# Patient Record
Sex: Male | Born: 1960 | Hispanic: No | Marital: Married | Smoking: Never smoker
Health system: Southern US, Community
[De-identification: ages and names within clinical notes are randomized; demographics above are authoritative.]

## PROBLEM LIST (undated history)

## (undated) DIAGNOSIS — G4733 Obstructive sleep apnea (adult) (pediatric): Secondary | ICD-10-CM

## (undated) DIAGNOSIS — I10 Essential (primary) hypertension: Secondary | ICD-10-CM

## (undated) HISTORY — DX: Obstructive sleep apnea (adult) (pediatric): G47.33

## (undated) HISTORY — PX: TONSILLECTOMY: SHX5217

## (undated) HISTORY — DX: Essential (primary) hypertension: I10

---

## 2005-09-20 ENCOUNTER — Ambulatory Visit (HOSPITAL_COMMUNITY): Admission: RE | Admit: 2005-09-20 | Discharge: 2005-09-20 | Payer: Self-pay | Admitting: Surgery

## 2005-09-20 ENCOUNTER — Encounter (INDEPENDENT_AMBULATORY_CARE_PROVIDER_SITE_OTHER): Payer: Self-pay | Admitting: *Deleted

## 2005-09-20 ENCOUNTER — Ambulatory Visit (HOSPITAL_BASED_OUTPATIENT_CLINIC_OR_DEPARTMENT_OTHER): Admission: RE | Admit: 2005-09-20 | Discharge: 2005-09-20 | Payer: Self-pay | Admitting: Surgery

## 2005-12-30 HISTORY — PX: LIPOMA EXCISION: SHX5283

## 2006-02-04 ENCOUNTER — Encounter: Admission: RE | Admit: 2006-02-04 | Discharge: 2006-02-04 | Payer: Self-pay | Admitting: Gastroenterology

## 2010-01-14 ENCOUNTER — Inpatient Hospital Stay (HOSPITAL_COMMUNITY): Admission: EM | Admit: 2010-01-14 | Discharge: 2010-01-18 | Payer: Self-pay | Admitting: Emergency Medicine

## 2010-01-14 ENCOUNTER — Ambulatory Visit: Payer: Self-pay | Admitting: Internal Medicine

## 2011-03-17 LAB — DIFFERENTIAL
Basophils Absolute: 0 10*3/uL (ref 0.0–0.1)
Basophils Absolute: 0 10*3/uL (ref 0.0–0.1)
Basophils Absolute: 0 10*3/uL (ref 0.0–0.1)
Basophils Relative: 0 % (ref 0–1)
Basophils Relative: 1 % (ref 0–1)
Basophils Relative: 1 % (ref 0–1)
Eosinophils Absolute: 0.1 10*3/uL (ref 0.0–0.7)
Eosinophils Absolute: 0.1 10*3/uL (ref 0.0–0.7)
Eosinophils Absolute: 0.1 10*3/uL (ref 0.0–0.7)
Eosinophils Absolute: 0.1 10*3/uL (ref 0.0–0.7)
Eosinophils Relative: 6 % — ABNORMAL HIGH (ref 0–5)
Eosinophils Relative: 9 % — ABNORMAL HIGH (ref 0–5)
Lymphocytes Relative: 27 % (ref 12–46)
Lymphocytes Relative: 47 % — ABNORMAL HIGH (ref 12–46)
Lymphs Abs: 0.3 10*3/uL — ABNORMAL LOW (ref 0.7–4.0)
Lymphs Abs: 0.3 10*3/uL — ABNORMAL LOW (ref 0.7–4.0)
Monocytes Absolute: 0.1 10*3/uL (ref 0.1–1.0)
Monocytes Absolute: 0.1 10*3/uL (ref 0.1–1.0)
Monocytes Absolute: 0.2 10*3/uL (ref 0.1–1.0)
Monocytes Relative: 9 % (ref 3–12)
Monocytes Relative: 9 % (ref 3–12)
Monocytes Relative: 9 % (ref 3–12)
Neutro Abs: 0.8 10*3/uL — ABNORMAL LOW (ref 1.7–7.7)
Neutrophils Relative %: 36 % — ABNORMAL LOW (ref 43–77)
Neutrophils Relative %: 39 % — ABNORMAL LOW (ref 43–77)
Neutrophils Relative %: 70 % (ref 43–77)

## 2011-03-17 LAB — CBC
HCT: 30.6 % — ABNORMAL LOW (ref 39.0–52.0)
HCT: 32.6 % — ABNORMAL LOW (ref 39.0–52.0)
HCT: 32.9 % — ABNORMAL LOW (ref 39.0–52.0)
HCT: 33.1 % — ABNORMAL LOW (ref 39.0–52.0)
HCT: 35.4 % — ABNORMAL LOW (ref 39.0–52.0)
Hemoglobin: 10.6 g/dL — ABNORMAL LOW (ref 13.0–17.0)
Hemoglobin: 11.2 g/dL — ABNORMAL LOW (ref 13.0–17.0)
Hemoglobin: 11.2 g/dL — ABNORMAL LOW (ref 13.0–17.0)
MCHC: 34.3 g/dL (ref 30.0–36.0)
MCV: 87.8 fL (ref 78.0–100.0)
MCV: 87.9 fL (ref 78.0–100.0)
RBC: 3.74 MIL/uL — ABNORMAL LOW (ref 4.22–5.81)
RBC: 3.76 MIL/uL — ABNORMAL LOW (ref 4.22–5.81)
RBC: 4.03 MIL/uL — ABNORMAL LOW (ref 4.22–5.81)
RDW: 13.5 % (ref 11.5–15.5)
RDW: 13.8 % (ref 11.5–15.5)
RDW: 14 % (ref 11.5–15.5)
WBC: 1.3 10*3/uL — CL (ref 4.0–10.5)
WBC: 2.1 10*3/uL — ABNORMAL LOW (ref 4.0–10.5)
WBC: 2.2 10*3/uL — ABNORMAL LOW (ref 4.0–10.5)

## 2011-03-17 LAB — URINALYSIS, ROUTINE W REFLEX MICROSCOPIC
Glucose, UA: NEGATIVE mg/dL
Hgb urine dipstick: NEGATIVE
Specific Gravity, Urine: 1.027 (ref 1.005–1.030)
Urobilinogen, UA: 0.2 mg/dL (ref 0.0–1.0)

## 2011-03-17 LAB — COMPREHENSIVE METABOLIC PANEL
Alkaline Phosphatase: 37 U/L — ABNORMAL LOW (ref 39–117)
BUN: 12 mg/dL (ref 6–23)
GFR calc Af Amer: >60 mL/min (ref >60)
Glucose, Bld: 110 mg/dL — ABNORMAL HIGH (ref 70–99)
Potassium: 4.1 mEq/L (ref 3.5–5.1)
Total Bilirubin: 0.3 mg/dL (ref 0.3–1.2)
Total Protein: 6.6 g/dL (ref 6.0–8.3)

## 2011-03-17 LAB — URINE CULTURE
Colony Count: NO GROWTH
Culture: NO GROWTH

## 2011-03-17 LAB — CULTURE, BLOOD (ROUTINE X 2)

## 2011-03-17 LAB — BASIC METABOLIC PANEL
Calcium: 8 mg/dL — ABNORMAL LOW (ref 8.4–10.5)
Creatinine, Ser: 1.04 mg/dL (ref 0.4–1.5)
GFR calc non Af Amer: >60 mL/min (ref >60)
Sodium: 133 mEq/L — ABNORMAL LOW (ref 135–145)

## 2011-03-17 LAB — LACTATE DEHYDROGENASE: LDH: 172 U/L (ref 94–250)

## 2011-03-17 LAB — TSH: TSH: 0.996 u[IU]/mL (ref 0.350–4.500)

## 2011-03-17 LAB — PATHOLOGIST SMEAR REVIEW

## 2011-05-17 NOTE — Op Note (Signed)
NAME:  SNELSON, MD                    ACCOUNT NO.:  [id number]   MEDICAL RECORD NO.:  [id number]          PATIENT TYPE:  OUT   LOCATION:  [facility]                         FACILITY:  [facility]   PHYSICIAN:  Sandria Bales. Ezzard Standing, M.D.  DATE OF BIRTH:  [birth year]   DATE OF PROCEDURE:  09/20/2005  DATE OF DISCHARGE:  09/20/2005                                 OPERATIVE REPORT   PREOPERATIVE DIAGNOSIS:  Mass, right axilla.   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   PROCEDURE:  Excision of right axillary mass.   SURGEON:  Sandria Bales. Ezzard Standing, M.D.   ASSISTANT:  None.   ANESTHESIA:  MAC with approximately 20 mL of 1% Xylocaine plain.   SPECIMENS:  Sent to pathology.   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  This is probably a lipoma  but could also be a lymph node.  The patient comes for excision of this  mass.   DESCRIPTION OF PROCEDURE:  The patient underwent MAC anesthesia.  His right  axilla was prepped with Betadine solution and sterilely draped.  A linear  incision was made directly over the mass and dissection was carried down to  the  mass.  The mass was excised and sent to pathology.  Hemostasis was  controlled with Bovie electrocautery.  The skin was closed with 3-0 Vicryl  sutures and a 5-0 running subcuticular Vicryl, painted with tincture of  Benzoin and Steri-Strips.   He will be seen back in two weeks for follow up, call for any problems.  Again, the specimen was sent to pathology.      Sandria Bales. Ezzard Standing, M.D.  Electronically Signed     DHN/MEDQ  D:  12/12/2005  T:  12/13/2005  Job:  [id number]

## 2012-08-04 ENCOUNTER — Encounter (HOSPITAL_COMMUNITY): Payer: Self-pay

## 2012-08-04 ENCOUNTER — Encounter (HOSPITAL_COMMUNITY): Payer: Self-pay | Admitting: Anesthesiology

## 2012-08-04 ENCOUNTER — Ambulatory Visit (HOSPITAL_COMMUNITY): Admit: 2012-08-04 | Payer: Self-pay | Admitting: Cardiology

## 2012-08-04 SURGERY — CARDIOVERSION
Anesthesia: Monitor Anesthesia Care

## 2016-10-07 ENCOUNTER — Ambulatory Visit (INDEPENDENT_AMBULATORY_CARE_PROVIDER_SITE_OTHER): Payer: BLUE CROSS/BLUE SHIELD | Admitting: Rheumatology

## 2016-10-07 DIAGNOSIS — M25562 Pain in left knee: Secondary | ICD-10-CM | POA: Diagnosis not present

## 2017-11-07 ENCOUNTER — Encounter: Payer: Self-pay | Admitting: Rheumatology

## 2017-11-07 ENCOUNTER — Ambulatory Visit (INDEPENDENT_AMBULATORY_CARE_PROVIDER_SITE_OTHER): Payer: BLUE CROSS/BLUE SHIELD | Admitting: Rheumatology

## 2017-11-07 VITALS — BP 119/74 | HR 60 | Resp 16 | Ht 64.0 in | Wt 136.0 lb

## 2017-11-07 DIAGNOSIS — I10 Essential (primary) hypertension: Secondary | ICD-10-CM | POA: Diagnosis not present

## 2017-11-07 DIAGNOSIS — M170 Bilateral primary osteoarthritis of knee: Secondary | ICD-10-CM | POA: Diagnosis not present

## 2017-11-07 DIAGNOSIS — M7712 Lateral epicondylitis, left elbow: Secondary | ICD-10-CM | POA: Diagnosis not present

## 2017-11-07 MED ORDER — LIDOCAINE HCL (PF) 1 % IJ SOLN
0.5000 mL | INTRAMUSCULAR | Status: AC | PRN
  Administered 2017-11-07: .5 mL

## 2017-11-07 MED ORDER — TRIAMCINOLONE ACETONIDE 40 MG/ML IJ SUSP
20.0000 mg | INTRAMUSCULAR | Status: AC | PRN
  Administered 2017-11-07: 20 mg via INTRA_ARTICULAR

## 2017-11-07 NOTE — Progress Notes (Signed)
Medication Samples have been provided to the patient.  Drug name: Flector Patch       Strength: 1.3%        Qty: 2 patches (1 box)  LOT: [id number]  Exp.Date: 09/2018  Dosing instructions: Apply patch topically to affected area every 12 hours.   The patient has been instructed regarding the correct time, dose, and frequency of taking this medication, including desired effects and most common side effects.   Essie HartJulie E Saunders 2:40 PM 11/07/2017

## 2017-11-07 NOTE — Progress Notes (Addendum)
Office Visit Note  Patient: Parker Campbell             Date of Birth: 1961/02/12           MRN: [id number]             PCP: Patient, No Pcp Per Referring: No ref. provider found Visit Date: 11/07/2017 Occupation: @GUAROCC @    Subjective:  Left elbow pain.   History of Present Illness: Parker Campbell is a 56 y.o. male seen on urgent basis today. He states he's been playing golf and developed pain in his left elbow. The symptoms have been going on for the last few weeks. He is not having any relief from using topical anti-inflammatories and also wearing a brace. He wants to have cortisone injection. He states his knee joint pain is tolerable. He is been running and exercising without much discomfort.  Activities of Daily Living:  Patient reports morning stiffness for 0 minute.   Patient Denies nocturnal pain.  Difficulty dressing/grooming: Denies Difficulty climbing stairs: Denies Difficulty getting out of chair: Denies Difficulty using hands for taps, buttons, cutlery, and/or writing: Denies   Review of Systems  Constitutional: Negative for fatigue, night sweats and weakness ( ).  HENT: Negative for mouth sores, mouth dryness and nose dryness.   Eyes: Negative for redness and dryness.  Respiratory: Negative for shortness of breath and difficulty breathing.   Cardiovascular: Negative for chest pain, palpitations, hypertension, irregular heartbeat and swelling in legs/feet.  Gastrointestinal: Negative for constipation and diarrhea.  Endocrine: Negative for increased urination.  Musculoskeletal: Positive for arthralgias and joint pain. Negative for joint swelling, myalgias, muscle weakness, morning stiffness, muscle tenderness and myalgias.  Skin: Negative for color change, rash, hair loss, nodules/bumps, skin tightness, ulcers and sensitivity to sunlight.  Allergic/Immunologic: Negative for susceptible to infections.  Neurological: Negative for dizziness, fainting, memory  loss and night sweats.  Hematological: Negative for swollen glands.  Psychiatric/Behavioral: Negative for depressed mood and sleep disturbance. The patient is not nervous/anxious.     PMFS History:  Patient Active Problem List   Diagnosis Date Noted  . Essential hypertension 11/07/2017  . Primary osteoarthritis of both knees 11/07/2017    Past Medical History:  Diagnosis Date  . Hypertension     Family History  Problem Relation Age of Onset  . CAD Father   . Alcoholism Brother    Past Surgical History:  Procedure Laterality Date  . LIPOMA EXCISION  2007   left axilla   . TONSILLECTOMY  age 89   Social History   Social History Narrative  . Not on file     Objective: Vital Signs: BP 119/74 (BP Location: Left Arm, Patient Position: Sitting, Cuff Size: Normal)   Pulse 60   Resp 16   Ht 5\' 4"  (1.626 m)   Wt 136 lb (61.7 kg)   BMI 23.34 kg/m    Physical Exam  Constitutional: He is oriented to person, place, and time. He appears well-developed and well-nourished.  HENT:  Head: Normocephalic and atraumatic.  Eyes: Conjunctivae and EOM are normal. Pupils are equal, round, and reactive to light.  Neck: Normal range of motion. Neck supple.  Cardiovascular: Normal rate, regular rhythm and normal heart sounds.  Pulmonary/Chest: Effort normal and breath sounds normal.  Abdominal: Soft. Bowel sounds are normal.  Neurological: He is alert and oriented to person, place, and time.  Skin: Skin is warm and dry. Capillary refill takes less than 2 seconds.  Psychiatric: He has  a normal mood and affect. His behavior is normal.  Nursing note and vitals reviewed.    Musculoskeletal Exam: C-spine and thoracic lumbar spine good range of motion. Shoulder joints elbow joints wrist joint MCPs PIPs DIPs with good range of motion. He had tenderness on palpation over left lateral epicondyle area. All the joints are full range of motion.  CDAI Exam: No CDAI exam completed.     Investigation: No additional findings.   Imaging: No results found.  Speciality Comments: No specialty comments available.    Procedures:  Medium Joint Inj: L lateral epicondyle on 11/07/2017 12:44 PM Indications: pain Details: 27 G 1.5 in needle, lateral approach Medications: 0.5 mL lidocaine (PF) 1 %; 20 mg triamcinolone acetonide 40 MG/ML Aspirate: 0 mL Outcome: tolerated well, no immediate complications Consent was given by the patient. Immediately prior to procedure a time out was called to verify the correct patient, procedure, equipment, support staff and site/side marked as required. Patient was prepped and draped in the usual sterile fashion.     Allergies: Patient has no allergy information on record.   Assessment / Plan:     Visit Diagnoses: Lateral epicondylitis, left elbow -the symptoms are consistent with left lateral epicondylitis triggered by playing golf. He's tried conservative therapy which has failed. Per his request. Was prepped in sterile fashion and injected with cortisone. The procedures as described above. Plan: Medium Joint Inj: L lateral epicondyle. I've also given prescription for Flector patch which can be used topically. Side effects were reviewed. I've advised him to use tennis elbow brace.   Primary osteoarthritis of both knees - And bilateral moderate chondromalacia patella  Essential hypertension : His blood pressure is controlled.   Orders: Orders Placed This Encounter  Procedures  . Medium Joint Inj: L lateral epicondyle   No orders of the defined types were placed in this encounter.     Follow-Up Instructions: Return if symptoms worsen or fail to improve.   Pollyann SavoyShaili Renisha Cockrum, MD  Note - This record has been created using Animal nutritionistDragon software.  Chart creation errors have been sought, but may not always  have been located. Such creation errors do not reflect on  the standard of medical care.

## 2020-03-24 ENCOUNTER — Ambulatory Visit: Payer: BC Managed Care – PPO | Attending: Internal Medicine

## 2020-03-24 ENCOUNTER — Other Ambulatory Visit: Payer: Self-pay

## 2020-03-24 DIAGNOSIS — Z20822 Contact with and (suspected) exposure to covid-19: Secondary | ICD-10-CM

## 2020-03-25 LAB — NOVEL CORONAVIRUS, NAA: SARS-CoV-2, NAA: NOT DETECTED

## 2020-03-25 LAB — SARS-COV-2, NAA 2 DAY TAT

## 2020-12-27 ENCOUNTER — Inpatient Hospital Stay (HOSPITAL_COMMUNITY): Admission: RE | Admit: 2020-12-27 | Payer: Self-pay | Source: Ambulatory Visit

## 2021-05-31 ENCOUNTER — Other Ambulatory Visit: Payer: Self-pay | Admitting: Internal Medicine

## 2021-05-31 DIAGNOSIS — E785 Hyperlipidemia, unspecified: Secondary | ICD-10-CM

## 2021-05-31 DIAGNOSIS — I10 Essential (primary) hypertension: Secondary | ICD-10-CM

## 2021-06-19 ENCOUNTER — Ambulatory Visit
Admission: RE | Admit: 2021-06-19 | Discharge: 2021-06-19 | Disposition: A | Discharge status: Home / Self Care | Payer: No Typology Code available for payment source | Source: Ambulatory Visit | Attending: Internal Medicine | Admitting: Internal Medicine

## 2021-06-19 DIAGNOSIS — I10 Essential (primary) hypertension: Secondary | ICD-10-CM

## 2021-06-19 DIAGNOSIS — E785 Hyperlipidemia, unspecified: Secondary | ICD-10-CM

## 2022-10-18 IMAGING — CT CT CARDIAC CORONARY ARTERY CALCIUM SCORE
3 series · 14 of 20 positions shown, 16 images · non-contrast
Comparison: None.

CLINICAL DATA: 59-year-old Indian male with history of hypertension
and hyperlipidemia.

EXAM:
CT CARDIAC CORONARY ARTERY CALCIUM SCORE
TECHNIQUE: Non-contrast imaging through the heart was performed using
prospective ECG gating. Image post processing was performed on an
independent workstation, allowing for quantitative analysis of the
heart and coronary arteries. Note that this exam targets the heart
and the chest was not imaged in its entirety.

[Series 2: calcium scoring 2.00 qr36 bestdiast 68% hrt calciu · axial · 0.32mm/px · z∈[+1580,+1664]mm · 4 of 70 slices shown]
[im 14/70  vessel]
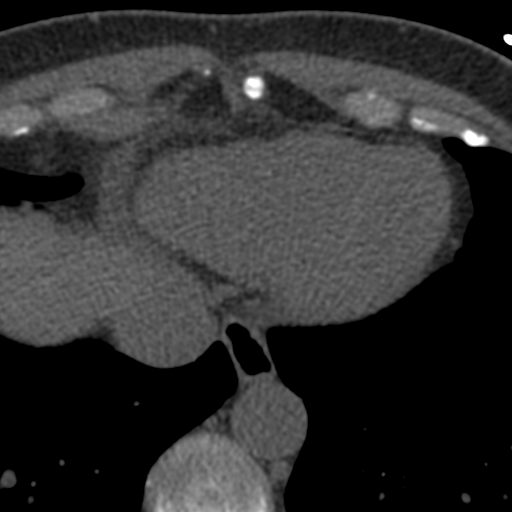
[im 28/70  vessel]
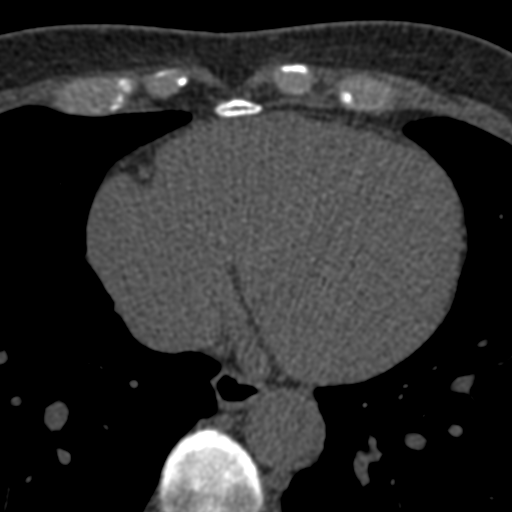
[im 42/70  vessel]
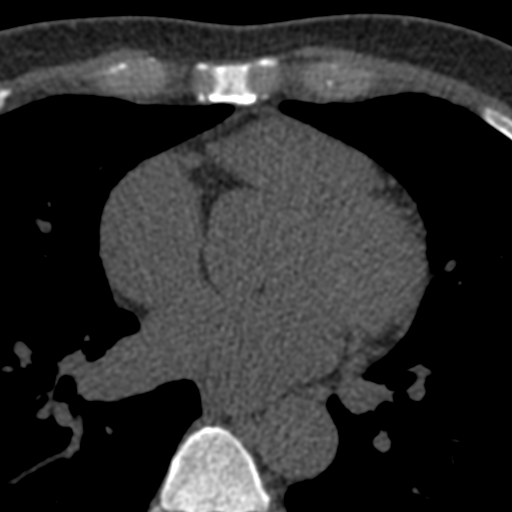
[im 56/70  vessel]
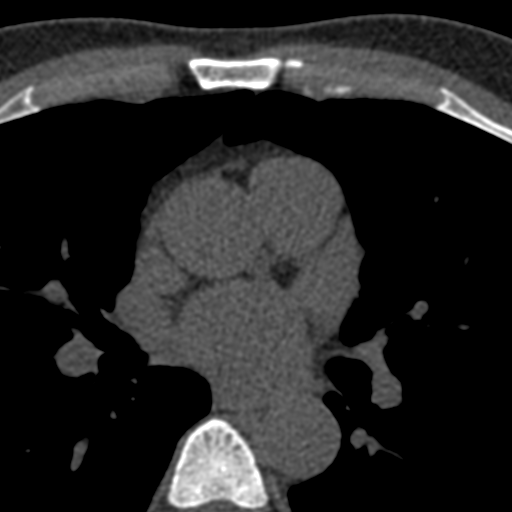

[Series 3: calcium scoring 2.00 br40 bestdiast 68% axial · axial · 0.55mm/px · z∈[+1576,+1668]mm · 5 of 70 slices shown, 7 images]
[im 12/70  vessel]
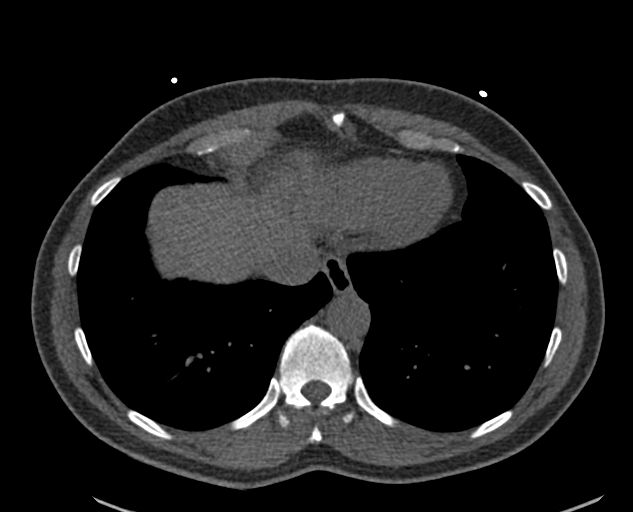
[im 12/70  lung]
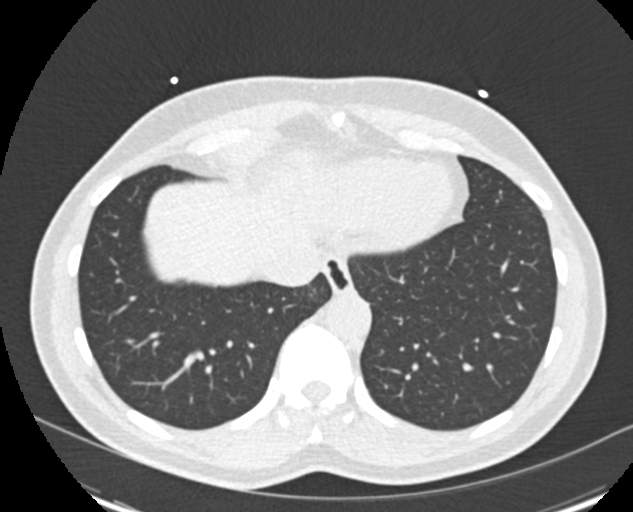
[im 24/70  vessel]
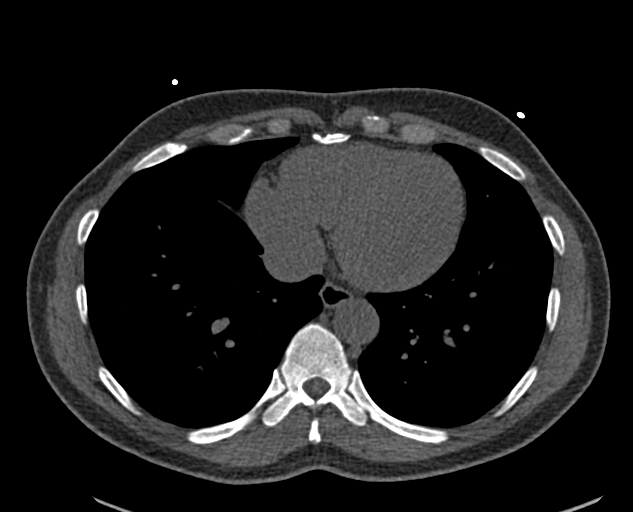
[im 35/70  vessel]
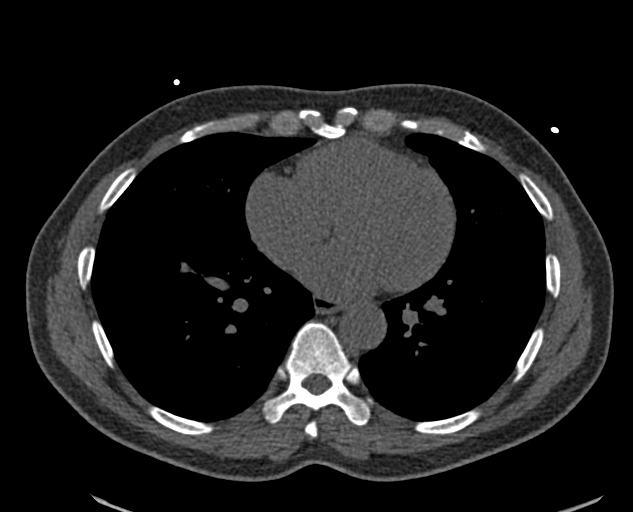
[im 47/70  vessel]
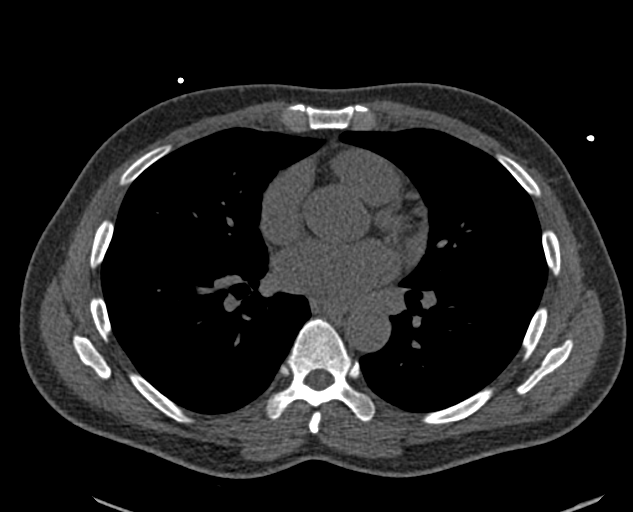
[im 58/70  vessel]
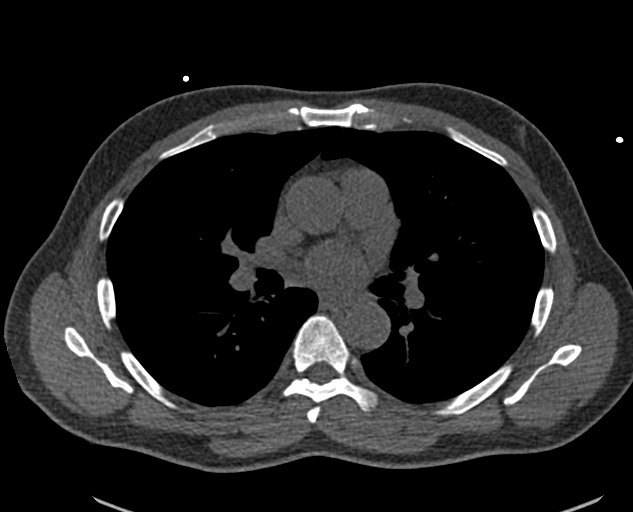
[im 58/70  lung]
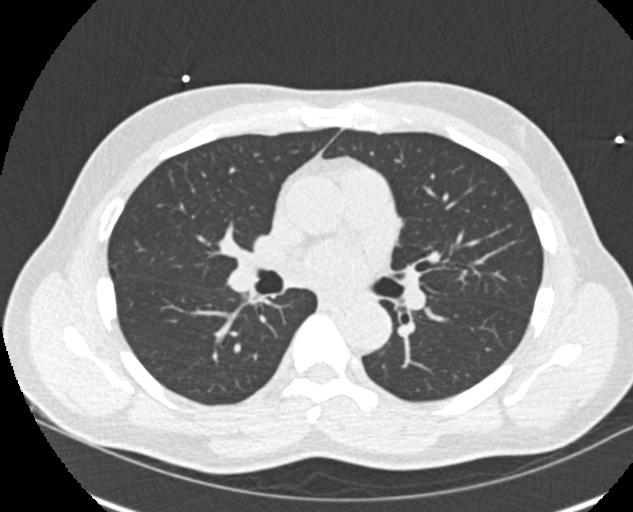

[Series 9: calcium scoring 2.00 br60 bestdiast 68% lungs · axial · 0.55mm/px · z∈[+1576,+1668]mm · 5 of 70 slices shown]
[im 12/70  vessel]
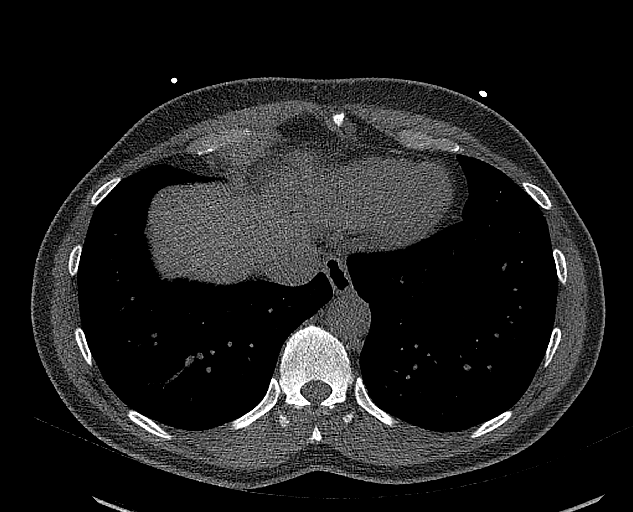
[im 24/70  vessel]
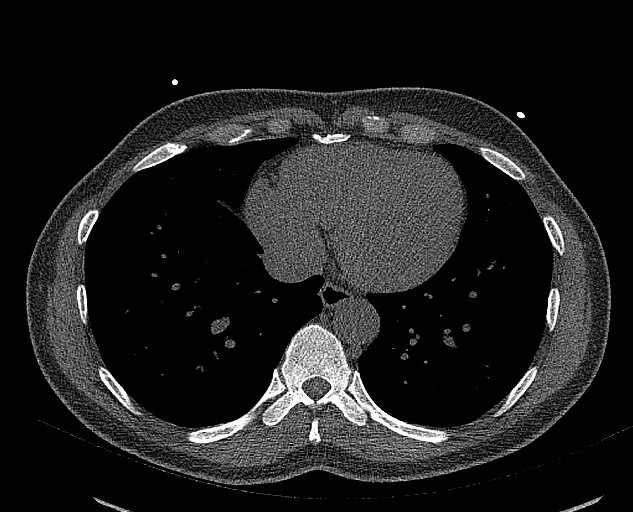
[im 35/70  vessel]
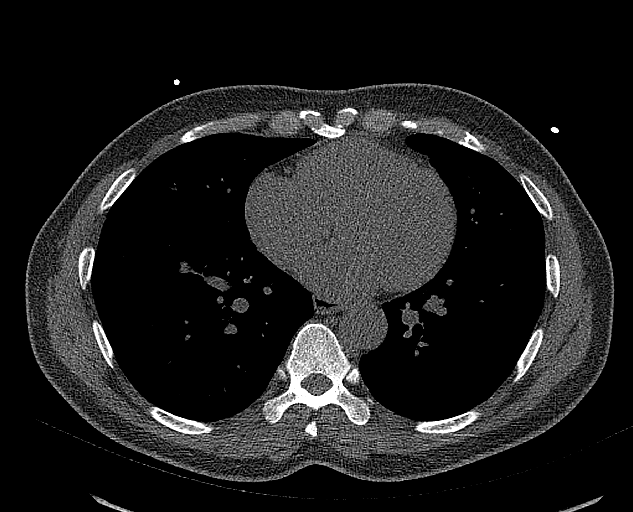
[im 47/70  vessel]
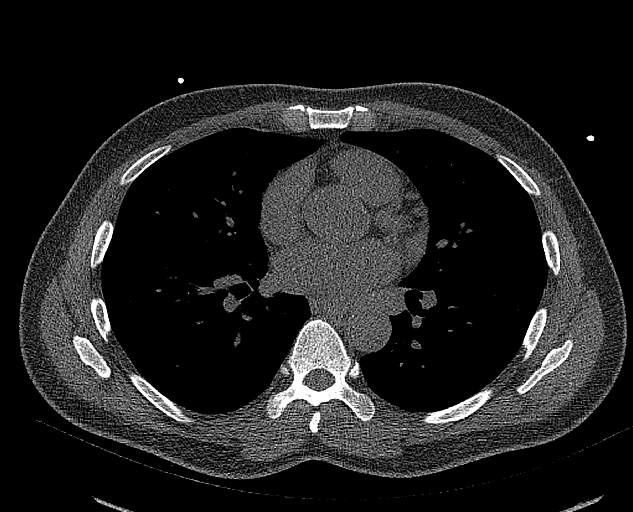
[im 58/70  vessel]
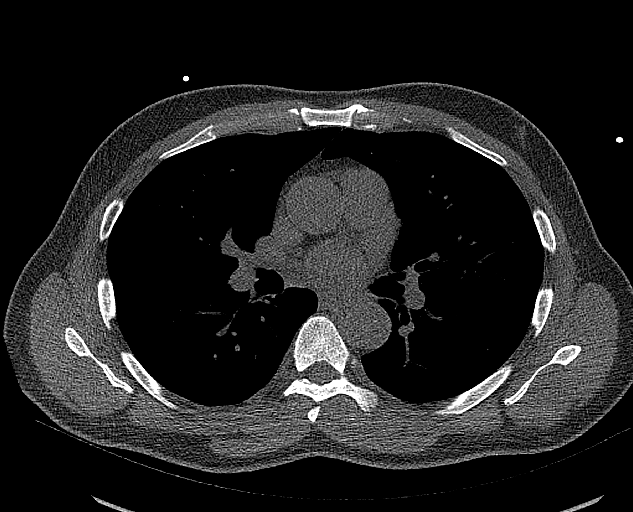

[14 of 20 positions shown; findings below may reference images not displayed]

FINDINGS: CORONARY CALCIUM SCORES:

Left Main: 0

LAD: 0

LCx: 0.5. This calcification is barely visible but is detectable as
meeting density criteria by software.

RCA: 0

Total Agatston Score:

[HOSPITAL] percentile: 51

AORTA MEASUREMENTS:

Ascending Aorta: 29 mm

Descending Aorta: 25 mm

OTHER FINDINGS:

The heart size is within normal limits. No pericardial fluid
identified. Visualized segments of the thoracic aorta and central
pulmonary arteries are normal in caliber. Visualized mediastinum and
hilar regions demonstrate no lymphadenopathy or masses. Visualized
lungs show no evidence of pulmonary edema, consolidation,
pneumothorax, nodule or pleural fluid. Visualized bony structures
and upper abdomen are unremarkable.
IMPRESSION: Subtle but detectable small calcification in the left circumflex
distribution for calcium score of 0.5 which is at the 51st
percentile based on age, sex and race matched data.

## 2023-10-08 ENCOUNTER — Telehealth: Payer: Self-pay | Admitting: *Deleted

## 2023-10-08 DIAGNOSIS — R0683 Snoring: Secondary | ICD-10-CM

## 2023-10-08 NOTE — Telephone Encounter (Signed)
Dr Mayford Knife would like patient to have Itamar Sleep study

## 2023-10-08 NOTE — Telephone Encounter (Signed)
STOP BANG RISK ASSESSMENT S (snore) Have you been told that you snore?     YES   T (tired) Are you often tired, fatigued, or sleepy during the day?   YES  O (obstruction) Do you stop breathing, choke, or gasp during sleep? YES   P (pressure) Do you have or are you being treated for high blood pressure? YES   B (BMI) Is your body index greater than 35 kg/m? NO   A (age) Are you 60 years old or older? YES   N (neck) Do you have a neck circumference greater than 16 inches?   NO   G (gender) Are you a male? YES   TOTAL STOP/BANG "YES" ANSWERS 6                                                                       For Office Use Only              Procedure Order Form    YES to 3+ Stop Bang questions OR two clinical symptoms - patient qualifies for WatchPAT (CPT 95800)

## 2023-10-08 NOTE — Telephone Encounter (Signed)
Per Dr. Mayford Knife ordered an Itamar study.   Patient agreement reviewed and signed on 10/08/2023.  WatchPAT issued to patient on 10/08/2023 by Danielle Rankin, CMA. Patient aware to not open the WatchPAT box until contacted with the activation PIN. Patient profile initialized in CloudPAT on 10/08/2023 by Danielle Rankin, CMA. Device serial number: [id number]  Please list Reason for Call as Advice Only and type "WatchPAT issued to patient" in the comment box.

## 2023-10-09 NOTE — Telephone Encounter (Signed)
Pt has been given PIN# [id number].   Patient agreement reviewed and signed on 10/09/2023.  WatchPAT issued to patient on 10/09/2023 by Danielle Rankin, CMA. Patient aware to not open the WatchPAT box until contacted with the activation PIN. Patient profile initialized in CloudPAT on 10/09/2023 by Danielle Rankin, CMA. Device serial number: PT HAS BEEN GIVEN PIN# [id number]  Please list Reason for Call as Advice Only and type "WatchPAT issued to patient" in the comment box.

## 2023-10-09 NOTE — Telephone Encounter (Signed)
Prior Authorization for Automatic Data sent to [employer]. Reference # . READY-APPROVED-NO PA REQ -[id number]  Ordering provider: Armanda Magic Associated diagnoses: R06.83 WatchPAT PA obtained on 10/09/2023 by Latrelle Dodrill, CMA. Authorization: No; tracking ID NO PA REQ. Patient notified of PIN ([id number]) on 10/09/2023 via Notification Method: in person.

## 2023-10-12 ENCOUNTER — Encounter (INDEPENDENT_AMBULATORY_CARE_PROVIDER_SITE_OTHER): Payer: Commercial Managed Care - PPO | Admitting: Cardiology

## 2023-10-12 DIAGNOSIS — G4733 Obstructive sleep apnea (adult) (pediatric): Secondary | ICD-10-CM | POA: Diagnosis not present

## 2023-10-26 ENCOUNTER — Ambulatory Visit: Payer: Commercial Managed Care - PPO | Attending: Cardiology

## 2023-10-26 DIAGNOSIS — R0683 Snoring: Secondary | ICD-10-CM

## 2023-10-26 NOTE — Procedures (Signed)
   SLEEP STUDY REPORT Patient Information Study Date: 10/12/2023 Patient Name: Parker Campbell Patient ID: [id number] Birth Date: 1961-09-29 Age: 62 Gender: Male BMI: 22.2 (W=130 lb, H=5' 4'') Stopbang: 15 Referring Physician: Armanda Magic, MD  TEST DESCRIPTION: Home sleep apnea testing was completed using the WatchPat, a Type 1 device, utilizing peripheral arterial tonometry (PAT), chest movement, actigraphy, pulse oximetry, pulse rate, body position and snore. AHI was calculated with apnea and hypopnea using valid sleep time as the denominator. RDI includes apneas, hypopneas, and RERAs. The data acquired and the scoring of sleep and all associated events were performed in accordance with the recommended standards and specifications as outlined in the AASM Manual for the Scoring of Sleep and Associated Events 2.2.0 (2015). FINDINGS:  1. Mild Obstructive Sleep Apnea with AHI 12.3/hr overall but moderate during REM sleep with REM AHI 18/hr.  2. No Central Sleep Apnea with pAHIc 2.5/hr.  3. Oxygen desaturations as low as 78%.  4. Moderate to severe snoring was present. O2 sats were < 88% for 4.8 min.  5. Total sleep time was 5 hrs and 36 min.  6. 26.6% of total sleep time was spent in REM sleep.  7. Normal sleep onset latency at 16 min.  8. ShortenedREM sleep onset latency at 81 min.  9. Total awakenings were 12. 10. 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 Clinical correlation of these findings is necessary. The decision to treat obstructive sleep apnea (OSA) is usually based on the presence of apnea symptoms or the presence of associated medical conditions such as Hypertension, Congestive Heart Failure, Atrial Fibrillation or Obesity. The most common symptoms of OSA are snoring, gasping for breath while sleeping, daytime sleepiness and fatigue. 2. Initiating apnea therapy is recommended given the presence of symptoms  and/or associated conditions. Recommend proceeding with one of the following:  a. Auto-CPAP therapy with a pressure range of 5-20cm H2O.  b. An oral appliance (OA) that can be obtained from certain dentists with expertise in sleep medicine. These are primarily of use in non-obese patients with mild and moderate disease.  c. An ENT consultation which may be useful to look for specific causes of obstruction and possible treatment options.  d. If patient is intolerant to PAP therapy, consider referral to ENT for evaluation for hypoglossal nerve stimulator. 3. Close follow-up is necessary to ensure success with CPAP or oral appliance therapy for maximum benefit . 4. A follow-up oximetry study on CPAP is recommended to assess the adequacy of therapy and determine the need for supplemental oxygen or the potential need for Bi-level therapy. An arterial blood gas to determine the adequacy of baseline ventilation and oxygenation should also be considered. 5. Healthy sleep recommendations include: adequate nightly sleep (normal 7-9 hrs/night), avoidance of caffeine after noon and alcohol near bedtime, and maintaining a sleep environment that is cool, dark and quiet. 6. Weight loss for overweight patients is recommended. Even modest amounts of weight loss can significantly improve the severity of sleep apnea. 7. Snoring recommendations include: weight loss where appropriate, side sleeping, and avoidance of alcohol before bed. 8. Operation of motor vehicle should be avoided when sleepy.  Signature: Armanda Magic, MD; [facility]; Diplomat, American Board of Sleep Medicine Electronically Signed: 10/26/2023 8:08:36 PM

## 2023-10-28 ENCOUNTER — Telehealth: Payer: Self-pay

## 2023-10-28 DIAGNOSIS — G4733 Obstructive sleep apnea (adult) (pediatric): Secondary | ICD-10-CM

## 2023-10-28 NOTE — Telephone Encounter (Signed)
-----   Message from Armanda Magic sent at 10/28/2023 11:26 AM EDT ----- Regarding: RE: Vilinda Boehringer send you to Dr. Suszanne Conners first and make sure there is no anatomical obstruction and then go from there.  Just let me know when you have seen them.   Alcario Drought Can you get Dr. Jacinto Halim in with Dr. Suszanne Conners with ENT please for mild OSA   Thanks Traci ----- Message ----- From: Yates Decamp, MD Sent: 10/28/2023  10:47 AM EDT To: Quintella Reichert, MD Subject: RE:                                            No problem at all. I totally understand.  I will do what you advise. My snoring is pretty severe as noted by my whole family. Only last few months I have started feeling tired in the morning.   I'm in [place] until Nov 16. But can have Teoh see me for ENT if you want me to or anyone you want me to see.  If you advise CPAP then I would do that. Leave it to your expertise ----- Message ----- From: Quintella Reichert, MD Sent: 10/26/2023   8:14 PM EDT To: Yates Decamp, MD  Vonna Kotyk  I am so sorry I am just getting this study read!  I had a few family emergencies in the past week and got 30 studies behind!! UGH.  You have mild to moderate OSA.  It is worse in REM sleep and likely fragmenting REM sleep making you tired.  You also have SEVERE snoring.  Normally I would recommend CPAP therapy but if you want to see an ENT first to see if there is any nasal or posterior oropharyngeal problem that could be fixed surgically or want to try an oral device first I am fine with either.  Lets talk early next week.  I am in the office all day tomorrow.  Traci

## 2023-10-28 NOTE — Telephone Encounter (Signed)
Referral placed for Dr. Suszanne Conners (ENT) for mild OSA.

## 2023-11-03 ENCOUNTER — Telehealth: Payer: Self-pay | Admitting: *Deleted

## 2023-11-03 ENCOUNTER — Encounter: Payer: Self-pay | Admitting: *Deleted

## 2023-11-03 DIAGNOSIS — R0683 Snoring: Secondary | ICD-10-CM

## 2023-11-03 DIAGNOSIS — I10 Essential (primary) hypertension: Secondary | ICD-10-CM

## 2023-11-03 DIAGNOSIS — G4733 Obstructive sleep apnea (adult) (pediatric): Secondary | ICD-10-CM

## 2023-11-03 NOTE — Telephone Encounter (Signed)
The patient has been notified of the result and verbalized understanding.  All questions (if any) were answered. Parker Campbell, CMA 11/03/2023 4:37 PM    Dr Jacinto Halim states he has an ENT appointment with Dr Janeece Riggers Philomena Doheny on 11/19 and he will talk to Dr Mayford Knife once he has that appointment.

## 2023-11-03 NOTE — Telephone Encounter (Signed)
-----   Message from Armanda Magic sent at 10/26/2023  8:10 PM EDT ----- I will let Dr. Jacinto Halim know that he has mild to moderate OSA and see how he wants to proceed

## 2023-11-03 NOTE — Telephone Encounter (Signed)
The patient has been notified of the result and verbalized understanding.  All questions (if any) were answered. Latrelle Dodrill, CMA 11/03/2023 5:53 PM     This encounter was created in error - please disregard.

## 2023-11-18 ENCOUNTER — Encounter (INDEPENDENT_AMBULATORY_CARE_PROVIDER_SITE_OTHER): Payer: Self-pay

## 2023-11-18 ENCOUNTER — Ambulatory Visit (INDEPENDENT_AMBULATORY_CARE_PROVIDER_SITE_OTHER): Payer: Commercial Managed Care - PPO | Admitting: Otolaryngology

## 2023-11-18 VITALS — Ht 64.0 in | Wt 138.0 lb

## 2023-11-18 DIAGNOSIS — G4733 Obstructive sleep apnea (adult) (pediatric): Secondary | ICD-10-CM

## 2023-11-19 DIAGNOSIS — G4733 Obstructive sleep apnea (adult) (pediatric): Secondary | ICD-10-CM | POA: Insufficient documentation

## 2023-11-19 NOTE — Progress Notes (Signed)
Patient ID: Parker Campbell, male   DOB: 12-03-1961, 62 y.o.   MRN: [id number]  CC: Obstructive sleep apnea, loud snoring  HPI:  Parker Campbell is a 62 y.o. male who presents today complaining of loud snoring for the past year.  He recently underwent a home polysomnography study.  The study showed mild to moderate obstructive sleep apnea, with AHI ranging from 12.3-18.  His oxygen saturation was down to 78% at one point.  The patient underwent adenotonsillectomy surgery as a child.  He has no other ENT surgery.  He has not yet been fitted with a CPAP machine.  Past Medical History:  Diagnosis Date   Hypertension     Past Surgical History:  Procedure Laterality Date   LIPOMA EXCISION  2007   left axilla    TONSILLECTOMY  age 53    Family History  Problem Relation Age of Onset   CAD Father    Alcoholism Brother     Social History:  reports that he has never smoked. He has never used smokeless tobacco. He reports current alcohol use of about 1.0 standard drink of alcohol per week. He reports that he does not use drugs.  Allergies:  Allergies  Allergen Reactions   Sulfa Antibiotics Anaphylaxis    Serum sickness   Ace Inhibitors Cough   Famotidine Rash    Prior to Admission medications   Medication Sig Start Date End Date Taking? Authorizing Provider  olmesartan (BENICAR) 20 MG tablet Take 20 mg every other day by mouth.    [provider]    Height 5\' 4"  (1.626 m), weight 138 lb (62.6 kg). Exam: General: Communicates without difficulty, well nourished, no acute distress. Head: Normocephalic, no evidence injury, no tenderness, facial buttresses intact without stepoff. Face/sinus: No tenderness to palpation and percussion. Facial movement is normal and symmetric. Eyes: PERRL, EOMI. No scleral icterus, conjunctivae clear. Neuro: CN II exam reveals vision grossly intact.  No nystagmus at any point of gaze. Ears: Auricles well formed without lesions.  Ear canals are  intact without mass or lesion.  No erythema or edema is appreciated.  The TMs are intact without fluid. Nose: External evaluation reveals normal support and skin without lesions.  Dorsum is intact.  Anterior rhinoscopy reveals normal mucosa over anterior aspect of inferior turbinates and intact septum.  No purulence noted. Oral:  Oral cavity and oropharynx are intact, symmetric, without erythema or edema.  Mucosa is moist without lesions.  Prominent uvula is noted.  Neck: Full range of motion without pain.  There is no significant lymphadenopathy.  No masses palpable.  Thyroid bed within normal limits to palpation.  Parotid glands and submandibular glands equal bilaterally without mass.  Trachea is midline. Neuro:  CN 2-12 grossly intact.    Assessment: 1.  Obstructive sleep apnea, with AHI of 12.3 - 18. 2.  The patient is noted to have a prominent uvula.  No other oropharyngeal abnormality is noted.  Plan: 1.  The physical exam findings and the PSG results are reviewed with the patient. 2.  Based on the above findings, the patient is strongly encouraged to try the CPAP machine. 3.  If he cannot tolerate the CPAP machine, he may be a candidate for inspire implant down the road.  Uriel Dowding W Laksh Hinners 11/19/2023, 9:22 AM

## 2023-11-21 NOTE — Telephone Encounter (Signed)
Per Dr Mayford Knife, would recommend that we start CPAP at 4-12cm H2O with mask of choice and then see me back in 4 weeks

## 2023-11-21 NOTE — Addendum Note (Signed)
Addended by: Reesa Chew on: 11/21/2023 06:22 PM   Modules accepted: Orders

## 2023-12-03 DIAGNOSIS — F902 Attention-deficit hyperactivity disorder, combined type: Secondary | ICD-10-CM | POA: Diagnosis not present

## 2023-12-03 DIAGNOSIS — F4323 Adjustment disorder with mixed anxiety and depressed mood: Secondary | ICD-10-CM | POA: Diagnosis not present

## 2023-12-09 ENCOUNTER — Telehealth: Payer: Self-pay

## 2023-12-09 DIAGNOSIS — G4733 Obstructive sleep apnea (adult) (pediatric): Secondary | ICD-10-CM

## 2023-12-09 NOTE — Telephone Encounter (Signed)
Notified patient of sleep study recommendations. All questions were answered (if any) and patient verbalized understanding. CPAP order sent to [facility] today.

## 2024-01-01 DIAGNOSIS — G4733 Obstructive sleep apnea (adult) (pediatric): Secondary | ICD-10-CM | POA: Diagnosis not present

## 2024-01-01 DIAGNOSIS — R400 Somnolence: Secondary | ICD-10-CM | POA: Diagnosis not present

## 2024-01-14 ENCOUNTER — Ambulatory Visit: Payer: Commercial Managed Care - PPO | Attending: Cardiology | Admitting: Cardiology

## 2024-01-14 ENCOUNTER — Encounter: Payer: Self-pay | Admitting: Cardiology

## 2024-01-14 VITALS — BP 102/58 | HR 58 | Ht 64.0 in | Wt 134.0 lb

## 2024-01-14 DIAGNOSIS — I10 Essential (primary) hypertension: Secondary | ICD-10-CM | POA: Diagnosis not present

## 2024-01-14 DIAGNOSIS — G4733 Obstructive sleep apnea (adult) (pediatric): Secondary | ICD-10-CM | POA: Diagnosis not present

## 2024-01-14 NOTE — Patient Instructions (Signed)
 Medication Instructions:  Your physician recommends that you continue on your current medications as directed. Please refer to the Current Medication list given to you today.  *If you need a refill on your cardiac medications before your next appointment, please call your pharmacy*   Lab Work: None.  If you have labs (blood work) drawn today and your tests are completely normal, you will receive your results only by: MyChart Message (if you have MyChart) OR A paper copy in the mail If you have any lab test that is abnormal or we need to change your treatment, we will call you to review the results.   Testing/Procedures: None.   Follow-Up:  Your next appointment:   1 year(s)  Provider:   Dr. Armanda Magic, MD

## 2024-01-14 NOTE — Progress Notes (Signed)
 Sleep Medicine CONSULT Note    Date:  01/14/2024   ID:  Parker Campbell, DOB 1961/11/19, MRN [id number]  PCP: Barnetta Liberty, MD Cardiologist: None   Chief Complaint  Patient presents with   New Patient (Initial Visit)    OSA    History of Present Illness:  Parker Campbell is a 63 y.o. male who is being seen today for the evaluation of OSA at the request of He, MD.  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  He recently complained of having problems with excessive daytime sleepiness and sleeping poorly at night, very fatigued and snoring very loudly to the point his wife would have to sleep in another room.  Stop Bang Score 6.  A home sleep study was done which demonstrated mild obstructive sleep apnea with an AHI of 12.3/h overall but moderate during REM sleep with a REM AHI of 18/h.  O2 sat nadir was 78%.  He was started on auto CPAP from 4 to 15 cm H2O.  He is now referred for sleep medicine consultation to establish sleep care.  He is is doing well with he PAP device and thinks that He has gotten used to it.  He tolerates the mask and feels the pressure is adequate.  Since going on PAP He feels rested in the am and has no significant daytime sleepiness.  He denies any significant mouth or nasal dryness or nasal congestion.  He does not think that he snores.    Past Medical History:  Diagnosis Date   Hypertension     Past Surgical History:  Procedure Laterality Date   LIPOMA EXCISION  2007   left axilla    TONSILLECTOMY  age 25    Current Medications: Current Meds  Medication Sig   Cholecalciferol (VITAMIN D3) 75 MCG (3000 UT) TABS    citalopram (CELEXA) 20 MG tablet Take 20 mg by mouth daily.   olmesartan (BENICAR) 20 MG tablet Take 20 mg every other day by mouth.    Allergies:   Sulfa antibiotics, Ace inhibitors, and Famotidine   Social History   Socioeconomic History   Marital status: Married    Spouse name: Not on file    Number of children: Not on file   Years of education: Not on file   Highest education level: Not on file  Occupational History   Not on file  Tobacco Use   Smoking status: Never   Smokeless tobacco: Never  Vaping Use   Vaping status: Never Used  Substance and Sexual Activity   Alcohol use: Yes    Alcohol/week: 1.0 standard drink of alcohol    Types: 1 Standard drinks or equivalent per week    Comment: 1 drink daily    Drug use: No   Sexual activity: Not on file  Other Topics Concern   Not on file  Social History Narrative   Not on file   Social Drivers of Health   Financial Resource Strain: Not on file  Food Insecurity: Not on file  Transportation Needs: Not on file  Physical Activity: Not on file  Stress: Not on file  Social Connections: Not on file     Family History:  The patient's family history includes Alcoholism in his brother; CAD in his father.   ROS:   Please see the history of present illness.    ROS All other systems reviewed and are negative.      No data to  display             PHYSICAL EXAM:   VS:  BP (!) 102/58   Pulse (!) 58   Ht 5\' 4"  (1.626 m)   Wt 134 lb (60.8 kg)   SpO2 98%   BMI 23.00 kg/m    GEN: Well nourished, well developed, in no acute distress  HEENT: normal  Neck: no JVD, carotid bruits, or masses Cardiac: RRR; no murmurs, rubs, or gallops,no edema.  Intact distal pulses bilaterally.  Respiratory:  clear to auscultation bilaterally, normal work of breathing GI: soft, nontender, nondistended, + BS MS: no deformity or atrophy  Skin: warm and dry, no rash Neuro:  Alert and Oriented x 3, Strength and sensation are intact Psych: euthymic mood, full affect  Wt Readings from Last 3 Encounters:  01/14/24 134 lb (60.8 kg)  11/18/23 138 lb (62.6 kg)  11/07/17 136 lb (61.7 kg)      Studies/Labs Reviewed:   Home sleep study, PAP compliance download  Recent Labs: No results found for requested labs within last 365 days.      ASSESSMENT:    1. OSA (obstructive sleep apnea)   2. Essential hypertension      PLAN:  In order of problems listed above:  OSA - The patient is tolerating PAP therapy well without any problems. The PAP download performed by his DME was personally reviewed and interpreted by me today and showed an AHI of 1.7/hr on auto CPAP from 6 to 16cm H2O cm H2O with 100% compliance in using more than 4 hours nightly.  The patient has been using and benefiting from PAP use and will continue to benefit from therapy.  -He has used his device 13 out of the last 30 days and when he does use his device on average he uses it for 6-1/2 hours.  Hypertension -BP controlled on exam today -Continue prescription drug management with Benicar 20 mg every other day  Followup with me in 1 year  Time Spent: 15 minutes total time of encounter, including 10 minutes spent in face-to-face patient care on the date of this encounter. This time includes coordination of care and counseling regarding above mentioned problem list. Remainder of non-face-to-face time involved reviewing chart documents/testing relevant to the patient encounter and documentation in the medical record. I have independently reviewed documentation from referring provider  Medication Adjustments/Labs and Tests Ordered: Current medicines are reviewed at length with the patient today.  Concerns regarding medicines are outlined above.  Medication changes, Labs and Tests ordered today are listed in the Patient Instructions below.  There are no Patient Instructions on file for this visit.   Signed, Gaylyn Keas, MD  01/14/2024 1:07 PM    [facility] [address] [place], [place], [place]  [address] Phone: [phone]; Fax: [phone]

## 2024-01-28 DIAGNOSIS — F4323 Adjustment disorder with mixed anxiety and depressed mood: Secondary | ICD-10-CM | POA: Diagnosis not present

## 2024-01-28 DIAGNOSIS — F902 Attention-deficit hyperactivity disorder, combined type: Secondary | ICD-10-CM | POA: Diagnosis not present

## 2024-02-01 DIAGNOSIS — R400 Somnolence: Secondary | ICD-10-CM | POA: Diagnosis not present

## 2024-02-01 DIAGNOSIS — G4733 Obstructive sleep apnea (adult) (pediatric): Secondary | ICD-10-CM | POA: Diagnosis not present

## 2024-02-06 DIAGNOSIS — R400 Somnolence: Secondary | ICD-10-CM | POA: Diagnosis not present

## 2024-02-06 DIAGNOSIS — G4733 Obstructive sleep apnea (adult) (pediatric): Secondary | ICD-10-CM | POA: Diagnosis not present

## 2024-02-29 DIAGNOSIS — R400 Somnolence: Secondary | ICD-10-CM | POA: Diagnosis not present

## 2024-02-29 DIAGNOSIS — G4733 Obstructive sleep apnea (adult) (pediatric): Secondary | ICD-10-CM | POA: Diagnosis not present

## 2024-03-17 DIAGNOSIS — Z125 Encounter for screening for malignant neoplasm of prostate: Secondary | ICD-10-CM | POA: Diagnosis not present

## 2024-03-17 DIAGNOSIS — E559 Vitamin D deficiency, unspecified: Secondary | ICD-10-CM | POA: Diagnosis not present

## 2024-03-17 DIAGNOSIS — R82998 Other abnormal findings in urine: Secondary | ICD-10-CM | POA: Diagnosis not present

## 2024-03-17 DIAGNOSIS — Z79899 Other long term (current) drug therapy: Secondary | ICD-10-CM | POA: Diagnosis not present

## 2024-03-17 DIAGNOSIS — I10 Essential (primary) hypertension: Secondary | ICD-10-CM | POA: Diagnosis not present

## 2024-03-31 DIAGNOSIS — G4733 Obstructive sleep apnea (adult) (pediatric): Secondary | ICD-10-CM | POA: Diagnosis not present

## 2024-03-31 DIAGNOSIS — R400 Somnolence: Secondary | ICD-10-CM | POA: Diagnosis not present

## 2024-04-21 DIAGNOSIS — E559 Vitamin D deficiency, unspecified: Secondary | ICD-10-CM | POA: Diagnosis not present

## 2024-04-21 DIAGNOSIS — Z1331 Encounter for screening for depression: Secondary | ICD-10-CM | POA: Diagnosis not present

## 2024-04-21 DIAGNOSIS — Z0000 Encounter for general adult medical examination without abnormal findings: Secondary | ICD-10-CM | POA: Diagnosis not present

## 2024-04-21 DIAGNOSIS — F901 Attention-deficit hyperactivity disorder, predominantly hyperactive type: Secondary | ICD-10-CM | POA: Diagnosis not present

## 2024-04-21 DIAGNOSIS — I10 Essential (primary) hypertension: Secondary | ICD-10-CM | POA: Diagnosis not present

## 2024-04-21 DIAGNOSIS — F524 Premature ejaculation: Secondary | ICD-10-CM | POA: Diagnosis not present

## 2024-04-21 DIAGNOSIS — Z1339 Encounter for screening examination for other mental health and behavioral disorders: Secondary | ICD-10-CM | POA: Diagnosis not present

## 2024-04-30 DIAGNOSIS — R400 Somnolence: Secondary | ICD-10-CM | POA: Diagnosis not present

## 2024-04-30 DIAGNOSIS — G4733 Obstructive sleep apnea (adult) (pediatric): Secondary | ICD-10-CM | POA: Diagnosis not present

## 2024-05-31 DIAGNOSIS — G4733 Obstructive sleep apnea (adult) (pediatric): Secondary | ICD-10-CM | POA: Diagnosis not present

## 2024-05-31 DIAGNOSIS — R400 Somnolence: Secondary | ICD-10-CM | POA: Diagnosis not present

## 2024-06-30 DIAGNOSIS — R400 Somnolence: Secondary | ICD-10-CM | POA: Diagnosis not present

## 2024-06-30 DIAGNOSIS — G4733 Obstructive sleep apnea (adult) (pediatric): Secondary | ICD-10-CM | POA: Diagnosis not present

## 2024-07-20 ENCOUNTER — Other Ambulatory Visit (HOSPITAL_COMMUNITY): Payer: Self-pay

## 2024-07-20 MED ORDER — CITALOPRAM HYDROBROMIDE 20 MG PO TABS
20.0000 mg | ORAL_TABLET | Freq: Every day | ORAL | 2 refills | Status: AC
  Filled 2024-07-20: qty 90, 90d supply, fill #0
  Filled 2024-12-27: qty 90, 90d supply, fill #1

## 2024-07-31 DIAGNOSIS — G4733 Obstructive sleep apnea (adult) (pediatric): Secondary | ICD-10-CM | POA: Diagnosis not present

## 2024-07-31 DIAGNOSIS — R400 Somnolence: Secondary | ICD-10-CM | POA: Diagnosis not present

## 2024-08-21 ENCOUNTER — Other Ambulatory Visit: Payer: Self-pay | Admitting: Medical Genetics

## 2024-08-31 DIAGNOSIS — G4733 Obstructive sleep apnea (adult) (pediatric): Secondary | ICD-10-CM | POA: Diagnosis not present

## 2024-08-31 DIAGNOSIS — R400 Somnolence: Secondary | ICD-10-CM | POA: Diagnosis not present

## 2024-09-06 DIAGNOSIS — G4733 Obstructive sleep apnea (adult) (pediatric): Secondary | ICD-10-CM | POA: Diagnosis not present

## 2024-09-06 DIAGNOSIS — R400 Somnolence: Secondary | ICD-10-CM | POA: Diagnosis not present

## 2024-09-22 ENCOUNTER — Other Ambulatory Visit (HOSPITAL_COMMUNITY): Payer: Self-pay

## 2024-09-22 ENCOUNTER — Encounter (HOSPITAL_COMMUNITY): Payer: Self-pay

## 2024-09-22 MED ORDER — OLMESARTAN MEDOXOMIL 20 MG PO TABS
20.0000 mg | ORAL_TABLET | Freq: Every day | ORAL | 3 refills | Status: AC
  Filled 2024-09-22: qty 90, 90d supply, fill #0
  Filled 2024-12-27: qty 90, 90d supply, fill #1

## 2024-09-27 ENCOUNTER — Other Ambulatory Visit (HOSPITAL_COMMUNITY): Payer: Self-pay

## 2024-10-20 ENCOUNTER — Other Ambulatory Visit

## 2024-10-25 ENCOUNTER — Other Ambulatory Visit: Payer: Self-pay | Admitting: Medical Genetics

## 2024-10-25 DIAGNOSIS — Z006 Encounter for examination for normal comparison and control in clinical research program: Secondary | ICD-10-CM

## 2024-12-07 ENCOUNTER — Telehealth: Payer: Self-pay | Admitting: Medical Genetics

## 2024-12-07 LAB — GENECONNECT MOLECULAR SCREEN

## 2024-12-09 ENCOUNTER — Encounter: Payer: Self-pay | Admitting: Cardiology

## 2024-12-15 NOTE — Telephone Encounter (Signed)
 [facility]  12/15/2024  4:16 PM  Confirmed I was speaking with Parker Campbell [id number] by using name and DOB. Informed participant the reason for this call is to follow-up on a recent sample the participant provided at one of the [facility] lab locations. Informed participant the test was not able to be completed with this sample and apologized for the inconvenience. Participant was requested to provide a new sample at one of our participating labs at no cost so that participant can continue participation and receive test results. Informed participant they do not need to be fasting and if there are other samples that need to be drawn, they can be done at the same visit. Participant has not had a blood transfusion or blood product in the last 30 days. Participant agreed to provide another sample. Participant was provided the [employer] program website to learn why this may have happened. Participant was thanked for their time and continued support of the above study.    Jordyn Pennstrom, BS [place]  [facility] Clinical Research Specialist II Direct Dial: [phone]  Fax: [phone]

## 2024-12-17 ENCOUNTER — Other Ambulatory Visit (HOSPITAL_COMMUNITY): Payer: Self-pay

## 2024-12-29 ENCOUNTER — Other Ambulatory Visit: Payer: Self-pay | Admitting: Medical Genetics

## 2024-12-29 DIAGNOSIS — Z006 Encounter for examination for normal comparison and control in clinical research program: Secondary | ICD-10-CM

## 2025-01-14 LAB — GENECONNECT MOLECULAR SCREEN: Genetic Analysis Overall Interpretation: NEGATIVE

## 2025-01-19 ENCOUNTER — Encounter: Payer: Self-pay | Admitting: Cardiology

## 2025-01-19 ENCOUNTER — Ambulatory Visit: Admitting: Cardiology

## 2025-01-19 VITALS — BP 120/70 | HR 66 | Ht 64.0 in | Wt 137.0 lb

## 2025-01-19 DIAGNOSIS — G4733 Obstructive sleep apnea (adult) (pediatric): Secondary | ICD-10-CM | POA: Diagnosis not present

## 2025-01-19 NOTE — Progress Notes (Unsigned)
 "    Sleep Medicine  Note    Date:  01/19/2025   ID:  [name], DOB [birth year], MRN [id number]  PCP: Glendia Freeman, MD Cardiologist: None   Chief Complaint  Patient presents with   Sleep Apnea    History of Present Illness:  Parker Campbell is a 64 y.o. male with a history of hypertension.   was done which demonstrated mild obstructive sleep apnea with an AHI of 12.3/h overall but moderate during REM sleep with a REM AHI of 18/h.  O2 sat nadir was 78%.  He was started on auto CPAP from 6-16 cm H2O.  He is now referred for sleep medicine consultation to establish sleep care.  He is doing well with his PAP device.  He tolerates the nasal pillow mask and feels the pressure is adequate.  He feels rested in the am and has no significant daytime sleepiness.  He denies any significant mouth or nasal dryness or nasal congestion.  He does not think that he snores. An Epworth Sleepiness Scale score was calculated the office today and this endorsed at 0 arguing against residual daytime sleepiness.   Past Medical History:  Diagnosis Date   Hypertension    OSA on CPAP    mild obstructive sleep apnea with an AHI of 12.3/h overall but moderate during REM sleep with a REM AHI of 18/h.  O2 sat nadir was 78%. On auto CPAP 4 to 15cm H2O    Past Surgical History:  Procedure Laterality Date   LIPOMA EXCISION  2007   left axilla    TONSILLECTOMY  age 69    Current Medications: No outpatient medications have been marked as taking for the 01/19/25 encounter (Clinical Support) with Shlomo Wilbert JONELLE, MD.    Allergies:   Sulfa antibiotics, Ace inhibitors, and Famotidine   Social History   Socioeconomic History   Marital status: Married    Spouse name: Not on file   Number of children: Not on file   Years of education: Not on file   Highest education level: Not on file  Occupational History   Not on file  Tobacco Use   Smoking status: Never   Smokeless tobacco: Never  Vaping Use    Vaping status: Never Used  Substance and Sexual Activity   Alcohol  use: Yes    Alcohol /week: 1.0 standard drink of alcohol     Types: 1 Standard drinks or equivalent per week    Comment: 1 drink daily    Drug use: No   Sexual activity: Not on file  Other Topics Concern   Not on file  Social History Narrative   Not on file   Social Drivers of Health   Tobacco Use: Low Risk (01/19/2025)   Patient History    Smoking Tobacco Use: Never    Smokeless Tobacco Use: Never    Passive Exposure: Not on file  Financial Resource Strain: Not on file  Food Insecurity: Not on file  Transportation Needs: Not on file  Physical Activity: Not on file  Stress: Not on file  Social Connections: Not on file  Depression (EYV7-0): Not on file  Alcohol  Screen: Not on file  Housing: Not on file  Utilities: Not on file  Health Literacy: Not on file     Family History:  The patient's family history includes Alcoholism in his brother; CAD in his father.   ROS:   Please see the history of present illness.    ROS All other systems  reviewed and are negative.      No data to display             PHYSICAL EXAM:   VS:  BP 120/70 (Cuff Size: Normal)   Pulse 66   Ht 5' 4 (1.626 m)   Wt 137 lb (62.1 kg)   SpO2 99%   BMI 23.52 kg/m    GEN: Well nourished, well developed in no acute distress HEENT: Normal NECK: No JVD; No carotid bruits LYMPHATICS: No lymphadenopathy CARDIAC:RRR, no murmurs, rubs, gallops RESPIRATORY:  Clear to auscultation without rales, wheezing or rhonchi  ABDOMEN: Soft, non-tender, non-distended MUSCULOSKELETAL:  No edema; No deformity  SKIN: Warm and dry NEUROLOGIC:  Alert and oriented x 3 PSYCHIATRIC:  Normal affect   Wt Readings from Last 3 Encounters:  01/19/25 137 lb (62.1 kg)  01/14/24 134 lb (60.8 kg)  11/18/23 138 lb (62.6 kg)      Studies/Labs Reviewed:   Home sleep study, PAP compliance download  Recent Labs: No results found for requested labs  within last 365 days.     ASSESSMENT:    1. OSA (obstructive sleep apnea)       PLAN:  In order of problems listed above:  OSA - The patient is tolerating PAP therapy well without any problems. The PAP download performed by his DME was personally reviewed and interpreted by me today and showed an AHI of 1.7 /hr on 6-16 cm H2O with 97% compliance in using more than 4 hours nightly.  The patient has been using and benefiting from PAP use and will continue to benefit from therapy.     Followup with me in 1 year  Time Spent: 15 minutes total time of encounter, including 10 minutes spent in face-to-face patient care on the date of this encounter. This time includes coordination of care and counseling regarding above mentioned problem list. Remainder of non-face-to-face time involved reviewing chart documents/testing relevant to the patient encounter and documentation in the medical record. I have independently reviewed documentation from referring provider  Medication Adjustments/Labs and Tests Ordered: Current medicines are reviewed at length with the patient today.  Concerns regarding medicines are outlined above.  Medication changes, Labs and Tests ordered today are listed in the Patient Instructions below.  There are no Patient Instructions on file for this visit.   Signed, Wilbert Bihari, MD  01/19/2025 1:11 PM    [facility] [address] [place], [place], [place]  [address] Phone: [phone]; Fax: [phone]   "

## 2025-01-19 NOTE — Patient Instructions (Signed)
 Medication Instructions:  Your physician recommends that you continue on your current medications as directed. Please refer to the Current Medication list given to you today.   *If you need a refill on your cardiac medications before your next appointment, please call your pharmacy*  Lab Work: NONE    Testing/Procedures: NONE   Follow-Up: At [facility], you and your health needs are our priority.  As part of our continuing mission to provide you with exceptional heart care, our providers are all part of one team.  This team includes your primary Cardiologist (physician) and Advanced Practice Providers or APPs (Physician Assistants and Nurse Practitioners) who all work together to provide you with the care you need, when you need it.  Your next appointment:   1 year(s)  Provider:   Wilbert Bihari, MD     Other Instructions

## 2025-01-20 ENCOUNTER — Telehealth: Payer: Self-pay | Admitting: *Deleted

## 2025-01-20 DIAGNOSIS — G4733 Obstructive sleep apnea (adult) (pediatric): Secondary | ICD-10-CM

## 2025-01-20 DIAGNOSIS — R0683 Snoring: Secondary | ICD-10-CM

## 2025-01-20 DIAGNOSIS — I10 Essential (primary) hypertension: Secondary | ICD-10-CM

## 2025-01-20 NOTE — Telephone Encounter (Signed)
 Order placed to [facility] via community message

## 2025-01-20 NOTE — Telephone Encounter (Signed)
-----   Message from Wilbert Bihari, MD sent at 01/20/2025  9:06 AM EST ----- Order new CPAP supplies and mask

## 2025-02-17 ENCOUNTER — Other Ambulatory Visit
